# Patient Record
Sex: Male | Born: 1985 | Race: White | Hispanic: No | Marital: Single | Smoking: Current every day smoker
Health system: Southern US, Community
[De-identification: ages and names within clinical notes are randomized; demographics above are authoritative.]

## PROBLEM LIST (undated history)

## (undated) DIAGNOSIS — Z8619 Personal history of other infectious and parasitic diseases: Secondary | ICD-10-CM

## (undated) HISTORY — PX: FRACTURE SURGERY: SHX138

## (undated) HISTORY — DX: Personal history of other infectious and parasitic diseases: Z86.19

---

## 2005-07-16 ENCOUNTER — Ambulatory Visit: Payer: Self-pay | Admitting: Family Medicine

## 2009-06-27 ENCOUNTER — Emergency Department: Payer: Self-pay | Admitting: Emergency Medicine

## 2010-11-15 ENCOUNTER — Encounter: Payer: Self-pay | Admitting: Family Medicine

## 2010-11-15 ENCOUNTER — Ambulatory Visit (INDEPENDENT_AMBULATORY_CARE_PROVIDER_SITE_OTHER): Payer: Self-pay | Admitting: Family Medicine

## 2010-11-15 DIAGNOSIS — M24819 Other specific joint derangements of unspecified shoulder, not elsewhere classified: Secondary | ICD-10-CM

## 2010-11-15 DIAGNOSIS — M25519 Pain in unspecified shoulder: Secondary | ICD-10-CM | POA: Insufficient documentation

## 2010-11-15 DIAGNOSIS — Z9189 Other specified personal risk factors, not elsewhere classified: Secondary | ICD-10-CM | POA: Insufficient documentation

## 2010-11-21 NOTE — Assessment & Plan Note (Signed)
Summary: PT TO RE-EST/ARM PAIN/CLE [employer]   Vital Signs:  Patient profile:   25 year old male Height:      74 inches Weight:      261.25 pounds BMI:     33.66 Temp:     98.1 degrees F oral Pulse rate:   72 / minute Pulse rhythm:   regular BP sitting:   120 / 84  (left arm) Cuff size:   regular  Vitals Entered By: Benny Lennert CMA Duncan Dull) (November 15, 2010 1:53 PM)  History of Present Illness: Chief complaint new patient to re-established with arm pain  25 year old male:  movement does cause some pain, there is some pain and burning works in shipping, breaking down   hasused some sports cream.  maybe some  The patient noted above presents with shoulder pain that has been ongoing for several weeks to months. there is no history of trauma or accident. The patient denies neck pain or radicular symptoms. Denies dislocation, subluxation, separation of the shoulder. But he does describe mechanical clicking and popping in the shoulder.  The patient does complain of pain in the overhead plane. Pain with overhead work.  Medications Tried: advil Ice or Heat: ice Tried PT: No  Prior shoulder Injury: No Prior surgery: No Prior fracture: No  denies frank dislocation lateral shoulder pain  Preventive Screening-Counseling & Management  Caffeine-Diet-Exercise     Does Patient Exercise: no      Drug Use:  no.    Allergies (verified): No Known Drug Allergies  Past History:  Past Medical History: CHICKENPOX, HX OF (ICD-V15.9)    Past Surgical History: none  Family History: Family History Breast cancer 1st degree relative <50 Family History Hypertension Family History Lung cancer  Social History: Occupation:Material handler Married Alcohol use-yes Drug use-no Regular exercise-no former smokerOccupation:  employed Drug Use:  no Does Patient Exercise:  no  Review of Systems       REVIEW OF SYSTEMS  GEN: No systemic complaints, no fevers, chills, sweats, or  other acute illnesses MSK: Detailed in the HPI GI: tolerating PO intake without difficulty Neuro: No numbness, parasthesias, or tingling associated. Otherwise the pertinent positives of the ROS are noted above.    Physical Exam  General:  GEN: Well-developed,well-nourished,in no acute distress; alert,appropriate and cooperative throughout examination HEENT: Normocephalic and atraumatic without obvious abnormalities. No apparent alopecia or balding. Ears, externally no deformities PULM: Breathing comfortably in no respiratory distress EXT: No clubbing, cyanosis, or edema PSYCH: Normally interactive. Cooperative during the interview. Pleasant. Friendly and conversant. Not anxious or depressed appearing. Normal, full affect.  Msk:  Shoulder: L Inspection: No muscle wasting or winging Ecchymosis/edema: neg  AC joint, scapula, clavicle:  mild ttp Cervical spine: NT, full ROM Spurling's: neg Abduction: full, 5/5 Flexion: full, 5/5 IR, full, lift-off: 5/5 - some pain ER at neutral: full, 5/5 AC crossover and compression: mild pos Neer: pos Hawkins: pos Drop Test: neg Empty Can: neg Supraspinatus insertion: NT Bicipital groove: NT Speed's: neg Yergason's: neg Sulcus sign: increased laxity without true sulcus APPREHENSION POSITIVE JOBE RELOCATION POSITIVE CRANK POSITIVE Scapular dyskinesis: none C5-T1 intact Sensation intact Grip 5/5    Impression & Recommendations:  Problem # 1:  SHOULDER JOINT INSTABILITY (ICD-718.81) Assessment New increased laxity at shoulder with mild subluxation events cannot rule out labral pathology with positive crank, jobe relocation, mechanical symptoms. most of pain is from secondary impingement.  RTC and scapular stabilization - would give long conservative trial. I reviewed with the  patient a handout from [employer] and [facility] regarding their condition and a set of home exercises. They indicated that they understood what was  discussed with them. All questions answered.  formal pt   Orders: Physical Therapy Referral (PT)  Problem # 2:  SHOULDER PAIN, LEFT (ICD-719.41) Assessment: New  His updated medication list for this problem includes:    Diclofenac Sodium 75 Mg Tbec (Diclofenac sodium) .Marland Kitchen... 1 by mouth two times a day  Complete Medication List: 1)  Diclofenac Sodium 75 Mg Tbec (Diclofenac sodium) .Marland Kitchen.. 1 by mouth two times a day  Patient Instructions: 1)  YOUTUBE: 2)  ROTATOR CUFF STRENGTHENING AND SCAPULAR STABILIZATION 3)  "SCAPULAR CLOCKS" 4)  Referral Appointment Information 5)  Day/Date: 6)  Time: 7)  Place/MD: 8)  Address: 9)  Phone/Fax: 10)  Patient given appointment information. Information/Orders faxed/mailed.  11)  RECHECK 6 WEEKS Prescriptions: DICLOFENAC SODIUM 75 MG TBEC (DICLOFENAC SODIUM) 1 by mouth two times a day  #60 x 2   Entered and Authorized by:   Hannah Beat MD   Signed by:   Hannah Beat MD on 11/15/2010   Method used:   Electronically to        [facility] (retail)       [address], [address]       [place], [place]  [address]       Ph: [phone]       Fax: [phone]   RxID:   [id number]    Orders Added: 1)  Physical Therapy Referral [PT] 2)  New Patient Level III [99203]    Prevention & Chronic Care Immunizations   Influenza vaccine: Not documented    Tetanus booster: Not documented    Pneumococcal vaccine: Not documented  Other Screening   Smoking status: Not documented   Prior Medications (reviewed today): None Current Allergies (reviewed today): No known allergies

## 2010-12-15 ENCOUNTER — Encounter: Payer: Self-pay | Admitting: Family Medicine

## 2010-12-15 NOTE — Progress Notes (Signed)
Addended by: Adriana Simas on: 12/15/2010 05:44 AM   Modules accepted: Orders

## 2011-01-03 ENCOUNTER — Ambulatory Visit: Payer: BC Managed Care – PPO | Admitting: Family Medicine

## 2011-01-03 DIAGNOSIS — Z0289 Encounter for other administrative examinations: Secondary | ICD-10-CM

## 2011-05-22 ENCOUNTER — Encounter: Payer: Self-pay | Admitting: Family Medicine

## 2011-05-22 ENCOUNTER — Ambulatory Visit (INDEPENDENT_AMBULATORY_CARE_PROVIDER_SITE_OTHER): Payer: BC Managed Care – PPO | Admitting: Family Medicine

## 2011-05-22 VITALS — BP 120/82 | HR 89 | Temp 98.6°F | Ht 74.0 in | Wt 257.1 lb

## 2011-05-22 DIAGNOSIS — F410 Panic disorder [episodic paroxysmal anxiety] without agoraphobia: Secondary | ICD-10-CM

## 2011-05-22 MED ORDER — LORAZEPAM 1 MG PO TABS: 1.0000 mg | ORAL_TABLET | Freq: Three times a day (TID) | ORAL | Status: AC | PRN

## 2011-05-22 MED ORDER — CITALOPRAM HYDROBROMIDE 20 MG PO TABS: 20.0000 mg | ORAL_TABLET | Freq: Every day | ORAL | Status: DC

## 2011-05-22 NOTE — Progress Notes (Signed)
  Subjective:    Patient ID: Shawn Pugh, male    DOB: 07-18-1986, 25 y.o.   MRN: [id number]  HPI    Review of Systems     Objective:   Physical Exam        Assessment & Plan:   1. Panic attacks  citalopram (CELEXA) 20 MG tablet, LORazepam (ATIVAN) 1 MG tablet    >25 minutes spent in face to face time with patient, >50% spent in counselling or coordination of care: pleasant young man, here with wife, who has been having escalating panic attacks, generalized anxiety over the last few months. Worsened after he stopped ETOH (now minimal) was drinking 6-12 drinks a day. Daily symptoms, more at night. Describes some sweating, nausea, chest pain, fear. An "emergency pill" that he borrowed from a relative has helped relieve symptoms. No depression. No crying. Some stress at work and has a 78 month old at home. Supportive wife.   Rec counselling, but he is hesitant right now. They will think about that.  Recheck in 4 weeks

## 2011-05-22 NOTE — Patient Instructions (Signed)
F/u 4-5 weeks  Think about the counselling

## 2011-06-06 ENCOUNTER — Ambulatory Visit (INDEPENDENT_AMBULATORY_CARE_PROVIDER_SITE_OTHER): Payer: BC Managed Care – PPO | Admitting: Family Medicine

## 2011-06-06 ENCOUNTER — Encounter: Payer: Self-pay | Admitting: Family Medicine

## 2011-06-06 DIAGNOSIS — J069 Acute upper respiratory infection, unspecified: Secondary | ICD-10-CM

## 2011-06-06 NOTE — Assessment & Plan Note (Signed)
See instructions

## 2011-06-06 NOTE — Patient Instructions (Signed)
Take Guaifenesin (400mg ), take 11/2 tabs by mouth AM and NOON. Get GUAIFENESIN by  going to [facility] or [facility] and getting MUCOUS RELIEF EXPECTORANT/CONGESTION. DO NOT GET MUCINEX (Timed Release Guaifenesin)  Drink lots of fluids. If ST, Gargle with 30ccs of warm salt water every half hour for 2 days as able. Patience is a virtue.

## 2011-06-06 NOTE — Progress Notes (Signed)
  Subjective:    Patient ID: Shawn Pugh, male    DOB: June 06, 1986, 25 y.o.   MRN: [id number]  HPIPt of Dr Copland's here as acute appt for ST, cough, body aches and weakness. He works at [employer]. 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 He has had ST for a few days, cough and body aches started this AM. He had difficulty getting going today and did not go to work.  He has had temp to 99+, tension/pressure it the head typically bitemporally, no ear pain, ST of a couple days, no rhinitis, cough occas bringing stuff up with chest tightness like burning and weakness. He denies N/V, no diarrhea. HE has taken Sudafed cold and flu med and Vit C.     Review of SystemsNoncontributory except as above.       Objective:   Physical Exam  Constitutional: He appears well-developed and well-nourished. No distress.  HENT:  Head: Normocephalic and atraumatic.  Right Ear: External ear normal.  Left Ear: External ear normal.  Nose: Nose normal.  Mouth/Throat: Oropharynx is clear and moist.  Eyes: Conjunctivae and EOM are normal. Pupils are equal, round, and reactive to light. Right eye exhibits no discharge. Left eye exhibits no discharge.  Neck: Normal range of motion. Neck supple.  Cardiovascular: Normal rate and regular rhythm.   Pulmonary/Chest: Effort normal and breath sounds normal. He has no wheezes.  Lymphadenopathy:    He has no cervical adenopathy.  Skin: He is not diaphoretic.          Assessment & Plan:

## 2011-06-25 ENCOUNTER — Ambulatory Visit: Payer: BC Managed Care – PPO | Admitting: Family Medicine

## 2011-06-25 DIAGNOSIS — Z0289 Encounter for other administrative examinations: Secondary | ICD-10-CM

## 2012-03-14 ENCOUNTER — Emergency Department: Payer: Self-pay | Admitting: Emergency Medicine

## 2012-03-14 LAB — CBC
HCT: 43.4 % (ref 40.0–52.0)
HGB: 15.3 g/dL (ref 13.0–18.0)
MCHC: 35.2 g/dL (ref 32.0–36.0)
MCV: 90 fL (ref 80–100)
RBC: 4.84 10*6/uL (ref 4.40–5.90)
WBC: 4.8 10*3/uL (ref 3.8–10.6)

## 2012-03-14 LAB — COMPREHENSIVE METABOLIC PANEL
Albumin: 4.5 g/dL (ref 3.4–5.0)
Alkaline Phosphatase: 105 U/L (ref 50–136)
Anion Gap: 8 (ref 7–16)
EGFR (Non-African Amer.): >60
Glucose: 112 mg/dL — ABNORMAL HIGH (ref 65–99)
Osmolality: 280 (ref 275–301)
Potassium: 3.6 mmol/L (ref 3.5–5.1)
Sodium: 140 mmol/L (ref 136–145)
Total Protein: 8 g/dL (ref 6.4–8.2)

## 2012-10-10 ENCOUNTER — Ambulatory Visit (INDEPENDENT_AMBULATORY_CARE_PROVIDER_SITE_OTHER): Payer: BC Managed Care – PPO | Admitting: Family Medicine

## 2012-10-10 ENCOUNTER — Encounter: Payer: Self-pay | Admitting: Family Medicine

## 2012-10-10 VITALS — BP 140/88 | HR 78 | Temp 98.6°F | Ht 74.0 in | Wt 202.5 lb

## 2012-10-10 DIAGNOSIS — R5381 Other malaise: Secondary | ICD-10-CM

## 2012-10-10 DIAGNOSIS — R531 Weakness: Secondary | ICD-10-CM | POA: Insufficient documentation

## 2012-10-10 DIAGNOSIS — R51 Headache: Secondary | ICD-10-CM

## 2012-10-10 DIAGNOSIS — R519 Headache, unspecified: Secondary | ICD-10-CM | POA: Insufficient documentation

## 2012-10-10 LAB — CBC WITH DIFFERENTIAL/PLATELET
Basophils Absolute: 0 10*3/uL (ref 0.0–0.1)
Basophils Relative: 0 % (ref 0–1)
Eosinophils Absolute: 0.1 10*3/uL (ref 0.0–0.7)
MCH: 30.5 pg (ref 26.0–34.0)
MCHC: 34.1 g/dL (ref 30.0–36.0)
Neutrophils Relative %: 50 % (ref 43–77)
Platelets: 195 10*3/uL (ref 150–400)
RBC: 4.63 MIL/uL (ref 4.22–5.81)
RDW: 13.6 % (ref 11.5–15.5)

## 2012-10-10 LAB — COMPREHENSIVE METABOLIC PANEL
ALT: 12 U/L (ref 0–53)
AST: 18 U/L (ref 0–37)
Albumin: 5 g/dL (ref 3.5–5.2)
BUN: 16 mg/dL (ref 6–23)
Calcium: 9.7 mg/dL (ref 8.4–10.5)
Chloride: 103 mEq/L (ref 96–112)
Potassium: 4.1 mEq/L (ref 3.5–5.3)

## 2012-10-10 NOTE — Progress Notes (Signed)
  Subjective:    Patient ID: Shawn Pugh, male    DOB: 05-10-1986, 27 y.o.   MRN: [id number]  HPI  27 year old previously healthy  male with headaches intermittantly  For last few months, more frequent in the last few weeks. Describes it as pain and pressure, foggy, lightheaded, hard to focus. Squeezing pain worse on the left lateral head. Rare nausea.  Mild photophobia Occuring more in AM.  Cold hands intermittantly.  Feeling weakness in legs and hands. No fatigue.   No fever.  Some stress. Wife just had a baby.  Skips meals a lot.. Only eats dinner.  He has lost a lot of weight in  last year 130 lbs total. Cut out sodas. Wt Readings from Last 3 Encounters:  10/10/12 202 lb 8 oz (91.853 kg)  06/06/11 251 lb 4 oz (113.966 kg)  05/22/11 257 lb 1.9 oz (116.629 kg)   Family history of HTN.  No thyroid issues in family.  DM runs in family and pt concerned about this.    Review of Systems  Constitutional: Positive for fatigue. Negative for fever.  HENT: Negative for ear pain.   Eyes: Negative for pain.  Respiratory: Negative for cough and shortness of breath.   Cardiovascular: Negative for chest pain.  Gastrointestinal: Negative for abdominal pain.       Objective:   Physical Exam  Constitutional: Vital signs are normal. He appears well-developed and well-nourished.  HENT:  Head: Normocephalic.  Right Ear: Hearing normal.  Left Ear: Hearing normal.  Nose: Nose normal.  Mouth/Throat: Oropharynx is clear and moist and mucous membranes are normal.  Neck: Trachea normal. Carotid bruit is not present. No mass and no thyromegaly present.  Cardiovascular: Normal rate, regular rhythm and normal pulses.  Exam reveals no gallop, no distant heart sounds and no friction rub.   No murmur heard.      No peripheral edema  Pulmonary/Chest: Effort normal and breath sounds normal. No respiratory distress.  Neurological: He has normal strength and normal reflexes. No cranial nerve  deficit or sensory deficit. He displays a negative Romberg sign.  Skin: Skin is warm, dry and intact. No rash noted.  Psychiatric: He has a normal mood and affect. His speech is normal and behavior is normal. Thought content normal.          Assessment & Plan:

## 2012-10-10 NOTE — Addendum Note (Signed)
Addended by: Alvina Chou on: 10/10/2012 03:33 PM   Modules accepted: Orders

## 2012-10-10 NOTE — Assessment & Plan Note (Addendum)
Possible tension headaches. Follow BP to determine if causing issue. Will eval  with labs.  Symptoms may simply be due to skipping meals and hypoglycemia. Pt encouraged to eat 3 meals a day.

## 2012-10-10 NOTE — Patient Instructions (Addendum)
Follow BP at home to make sure not running high as cause of headache. Goal <140/90.  Do not skip meals. Try to eat  3 healthy meals with snacks in between. We will call you with lab results.

## 2012-10-10 NOTE — Assessment & Plan Note (Signed)
Will eval with labs. Given cold intolerance consider thyroid issue.

## 2013-03-03 IMAGING — CR DG CHEST 2V
1 series · 2 of 2 positions shown · non-contrast
Comparison: none

REASON FOR EXAM: cough
COMMENTS:

[Series 1: w chest pa · 0.14mm/px · 2 of 2 slices shown]
[im 1/2]
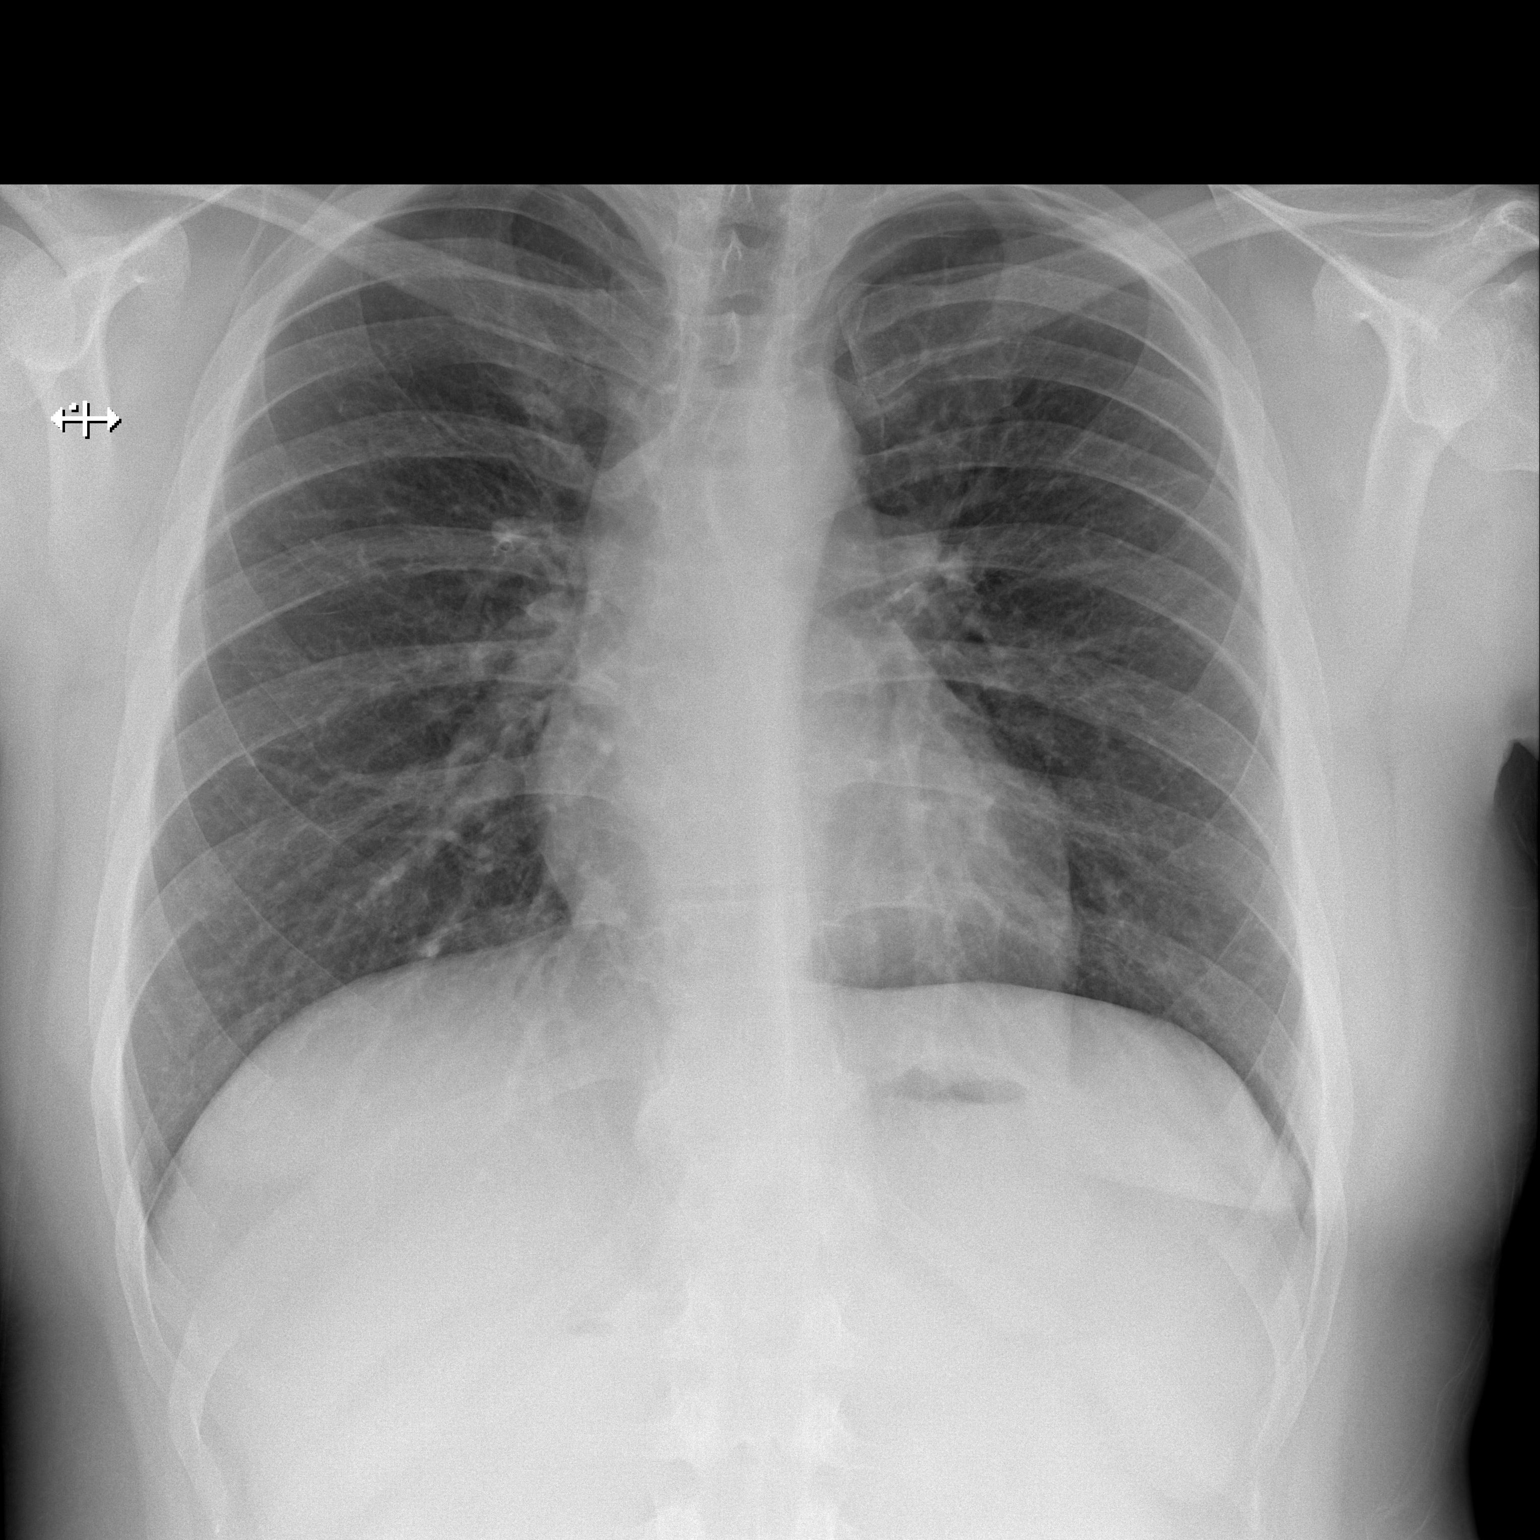
[im 2/2]
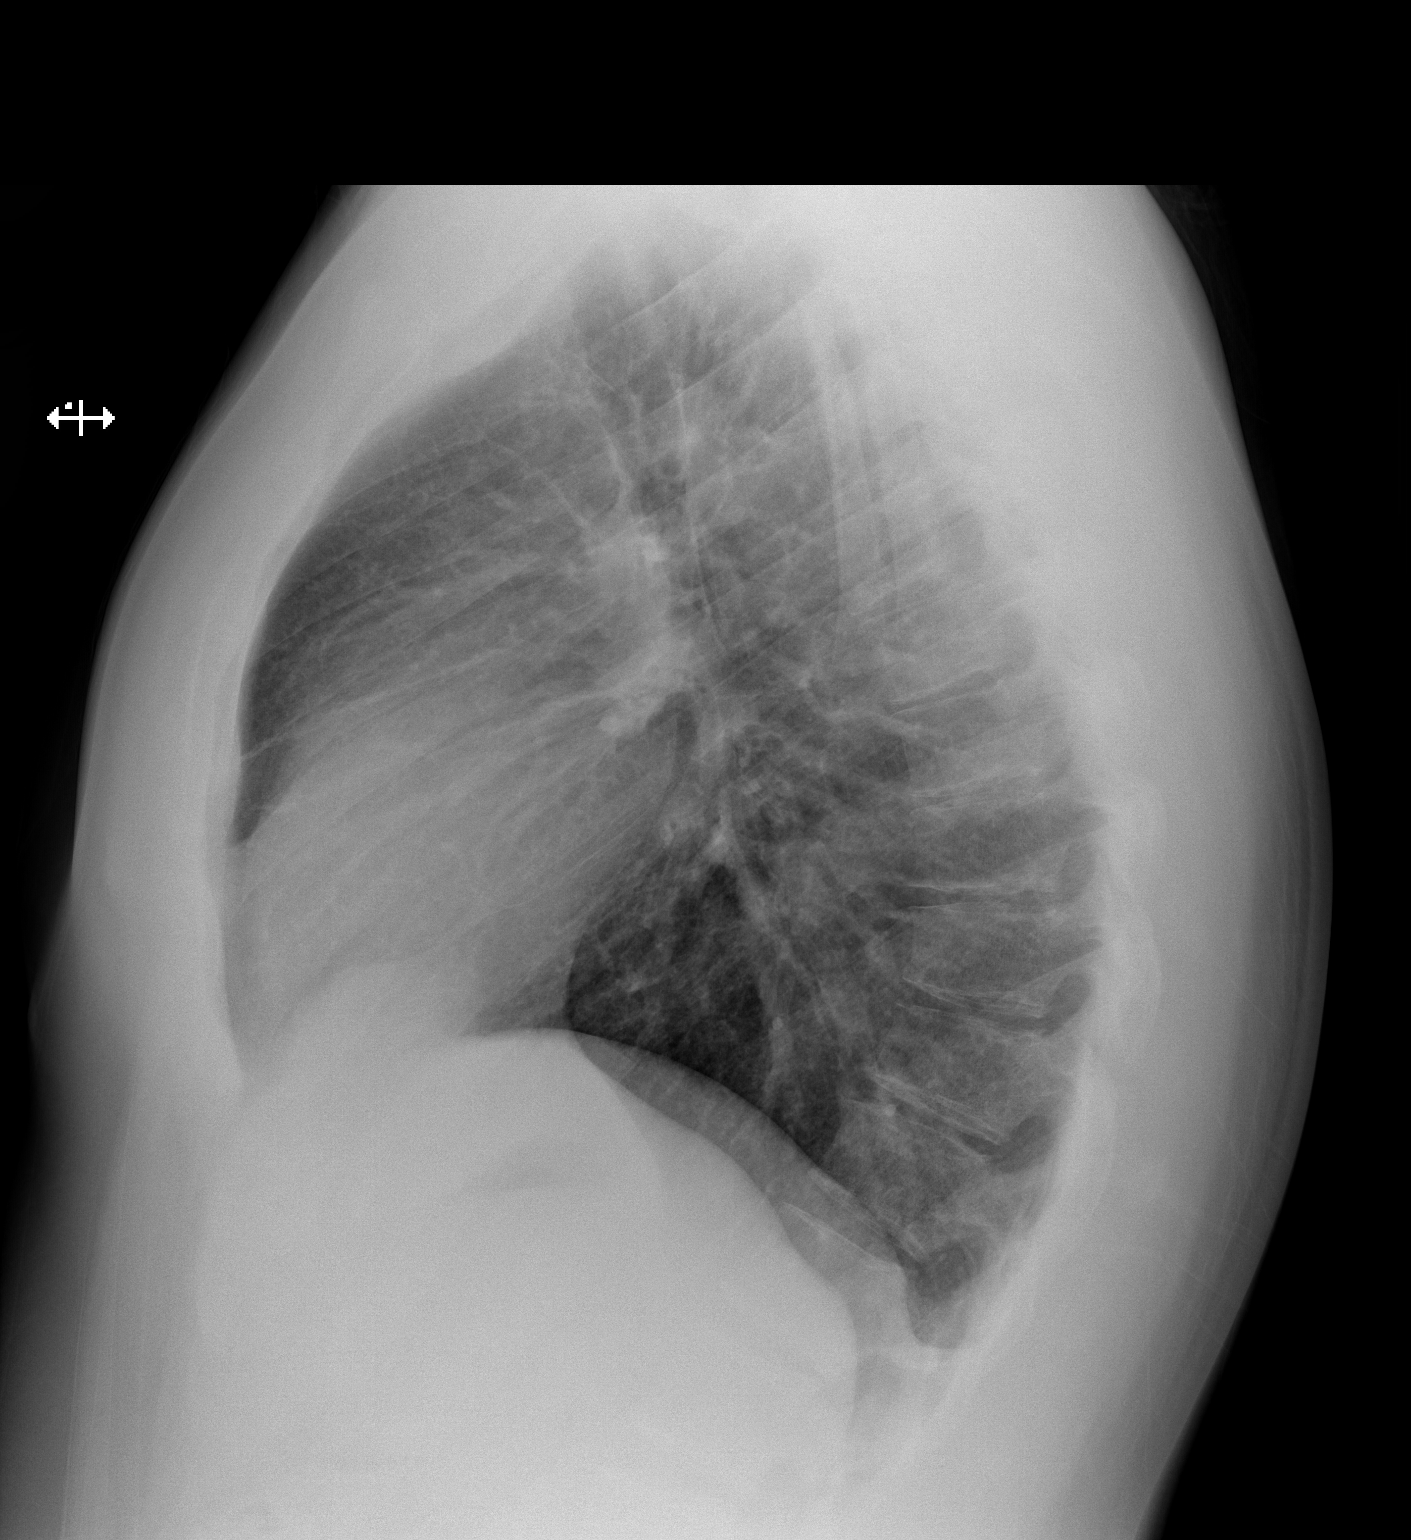

[2 of 2 positions shown; findings below may reference images not displayed]

PROCEDURE:     DXR - DXR CHEST PA (OR AP) AND LATERAL  - March 14, 2012  [DATE]

RESULT:     The lungs are well-expanded. There is no focal infiltrate. The
interstitial markings of both lungs are mildly prominent. Slightly increased
density is noted in the retrocardiac region on the left as compared to the
earlier study. The cardiac silhouette is normal in size. The pulmonary
vascularity is not engorged.
IMPRESSION: 1. There is no focal pneumonia but mildly increased interstitial markings
are noted diffusely. There are also coarse lung markings in the retrocardiac
region on the left which may reflect subsegmental atelectasis.
2. There is no evidence of CHF nor of a pleural effusion.

If the patient's symptoms persist and remain unexplained, a followup CT scan
of the chest may be useful.

## 2015-05-30 ENCOUNTER — Ambulatory Visit (INDEPENDENT_AMBULATORY_CARE_PROVIDER_SITE_OTHER): Payer: Self-pay | Admitting: Primary Care

## 2015-05-30 ENCOUNTER — Encounter: Payer: Self-pay | Admitting: Primary Care

## 2015-05-30 VITALS — BP 124/76 | HR 83 | Temp 98.1°F | Ht 74.0 in | Wt 211.8 lb

## 2015-05-30 DIAGNOSIS — K121 Other forms of stomatitis: Secondary | ICD-10-CM

## 2015-05-30 MED ORDER — LIDOCAINE VISCOUS 2 % MT SOLN: OROMUCOSAL | Status: DC

## 2015-05-30 MED ORDER — AMOXICILLIN-POT CLAVULANATE 875-125 MG PO TABS: 1.0000 | ORAL_TABLET | Freq: Two times a day (BID) | ORAL | Status: DC

## 2015-05-30 NOTE — Patient Instructions (Signed)
Start Augmentin antibiotics for mouth sore. Take 1 tablet by mouth twice daily for 7 days.  Swish and spit the viscous lidocaine four times daily as needed for mouth pain.  Follow up if no improvement once antibiotics are complete.  It was a pleasure meeting you!

## 2015-05-30 NOTE — Progress Notes (Signed)
   Subjective:    Patient ID: Shawn Pugh, male    DOB: [birth year], 29 y.o.   MRN: [id number]  HPI  Shawn Pugh is a 29 year old male who presents today with a chief complaint of mouth sore. He bit the inside of his mouth on the left side about 1 week ago while eating. He has had continued pain and swelling over the weekend which will fluctuate in intensity throughout the day. He's been using OTC orajel and kank-x with temporary relief. Denies fevers, chills. His pain is worse with eating and drinking.  Review of Systems  Constitutional: Negative for fever and chills.  HENT: Positive for sore throat.        Oral pain and swelling to left inner cheek  Gastrointestinal: Positive for nausea.       Past Medical History  Diagnosis Date  . History of chicken pox     Social History   Social History  . Marital Status: Single    Spouse Name: N/A  . Number of Children: N/A  . Years of Education: N/A   Occupational History  . Material handler    Social History Main Topics  . Smoking status: Former Games developer  . Smokeless tobacco: Former Neurosurgeon     Comment: quit 2-3 years ago  . Alcohol Use: Yes     Comment: over the weekend  . Drug Use: No  . Sexual Activity: Not on file   Other Topics Concern  . Not on file   Social History Narrative   No regular exercise    No past surgical history on file.  Family History  Problem Relation Age of Onset  . Breast cancer      1st degree relative  . Hypertension      fam hx  . Lung cancer      fam hx    No Known Allergies  No current outpatient prescriptions on file prior to visit.   No current facility-administered medications on file prior to visit.    BP 124/76 mmHg  Pulse 83  Temp(Src) 98.1 F (36.7 C) (Oral)  Ht  (1.88 m)  Wt 211 lb 12.8 oz (96.072 kg)  BMI 27.18 kg/m2  SpO2 98%    Objective:   Physical Exam  Constitutional: He appears well-nourished.  HENT:  1 cm sore to left side of cheek in mouth.  Erythema and swelling noted. Swelling noted to exterior cheek.  Cardiovascular: Normal rate and regular rhythm.   Pulmonary/Chest: Effort normal and breath sounds normal.  Skin: Skin is warm and dry.          Assessment & Plan:  Oral Ulcer:  Present to left inner cheek x 1 week after biting skin while eating. Increased pain, swelling, and redness of past several days. Swelling to exterior cheek. Ulcer is approx. 1 cm in diameter. RX for Augmentin BID x 7 days and viscous lidocaine QID prn. Follow up PRN or if no improvement with antibiotics.

## 2015-05-30 NOTE — Progress Notes (Signed)
Pre visit review using our clinic review tool, if applicable. No additional management support is needed unless otherwise documented below in the visit note. 

## 2015-10-13 ENCOUNTER — Emergency Department
Admission: EM | Admit: 2015-10-13 | Discharge: 2015-10-13 | Disposition: A | Discharge status: Home / Self Care | Payer: Medicaid Other | Attending: Emergency Medicine | Admitting: Emergency Medicine

## 2015-10-13 ENCOUNTER — Encounter: Payer: Self-pay | Admitting: *Deleted

## 2015-10-13 DIAGNOSIS — K602 Anal fissure, unspecified: Secondary | ICD-10-CM

## 2015-10-13 DIAGNOSIS — Z87891 Personal history of nicotine dependence: Secondary | ICD-10-CM | POA: Insufficient documentation

## 2015-10-13 DIAGNOSIS — K625 Hemorrhage of anus and rectum: Secondary | ICD-10-CM | POA: Diagnosis present

## 2015-10-13 LAB — COMPREHENSIVE METABOLIC PANEL
ALBUMIN: 5 g/dL (ref 3.5–5.0)
ALK PHOS: 59 U/L (ref 38–126)
ALT: 15 U/L — AB (ref 17–63)
AST: 18 U/L (ref 15–41)
Anion gap: 8 (ref 5–15)
BILIRUBIN TOTAL: 1.3 mg/dL — AB (ref 0.3–1.2)
BUN: 14 mg/dL (ref 6–20)
CALCIUM: 9.5 mg/dL (ref 8.9–10.3)
CO2: 25 mmol/L (ref 22–32)
CREATININE: 0.79 mg/dL (ref 0.61–1.24)
Chloride: 107 mmol/L (ref 101–111)
GFR calc Af Amer: >60 mL/min (ref >60)
GFR calc non Af Amer: >60 mL/min (ref >60)
GLUCOSE: 109 mg/dL — AB (ref 65–99)
POTASSIUM: 3.6 mmol/L (ref 3.5–5.1)
Sodium: 140 mmol/L (ref 135–145)
TOTAL PROTEIN: 7.6 g/dL (ref 6.5–8.1)

## 2015-10-13 LAB — CBC
HEMATOCRIT: 42.5 % (ref 40.0–52.0)
Hemoglobin: 14.7 g/dL (ref 13.0–18.0)
MCH: 31.4 pg (ref 26.0–34.0)
MCHC: 34.6 g/dL (ref 32.0–36.0)
MCV: 90.7 fL (ref 80.0–100.0)
PLATELETS: 197 10*3/uL (ref 150–440)
RBC: 4.69 MIL/uL (ref 4.40–5.90)
RDW: 13.8 % (ref 11.5–14.5)
WBC: 7.1 10*3/uL (ref 3.8–10.6)

## 2015-10-13 MED ORDER — HYDROCORTISONE ACETATE 25 MG RE SUPP: 25.0000 mg | Freq: Two times a day (BID) | RECTAL | Status: DC

## 2015-10-13 NOTE — ED Notes (Signed)
States he felt a "pop" in his rectum and states he went to the bathroom and noticed blood, denies blood in his stool but states just blood coming out, denies any known hx of Hemorid, awake and alert in no acute distress, denies any pain

## 2015-10-13 NOTE — Discharge Instructions (Signed)
Anal Fissure, Adult °An anal fissure is a small tear or crack in the skin around the anus. Bleeding from a fissure usually stops on its own within a few minutes. However, bleeding will often occur again with each bowel movement until the crack heals. °CAUSES °This condition may be caused by: °· Passing large, hard stool (feces). °· Frequent diarrhea. °· Constipation. °· Inflammatory bowel disease (Crohn disease or ulcerative colitis). °· Infections. °· Anal sex. °SYMPTOMS °Symptoms of this condition include: °· Bleeding from the rectum. °· Small amounts of blood seen on your stool, on toilet paper, or in the toilet after a bowel movement. °· Painful bowel movements. °· Itching or irritation around the anus. °DIAGNOSIS  °A health care provider may diagnose this condition by closely examining the anal area. An anal fissure can usually be seen with careful inspection. In some cases, a rectal exam may be performed, or a short tube (anoscope) may be used to examine the anal canal. °TREATMENT °Treatment for this condition may include: °· Taking steps to avoid constipation. This may include making changes to your diet, such as increasing your intake of fiber or fluid. °· Taking fiber supplements. These supplements can soften your stool to help make bowel movements easier. Your health care provider may also prescribe a stool softener if your stool is often hard. °· Taking sitz baths. This may help to heal the tear. °· Using medicated creams or ointments. These may be prescribed to lessen discomfort. °HOME CARE INSTRUCTIONS °Eating and Drinking °· Avoid foods that may be constipating, such as bananas and dairy products. °· Drink enough fluid to keep your urine clear or pale yellow. °· Maintain a diet that is high in fruits, whole grains, and vegetables. °General Instructions °· Keep the anal area as clean and dry as possible. °· Take sitz baths as told by your health care provider. Do not use soap in the sitz baths. °· Take  over-the-counter and prescription medicines only as told by your health care provider. °· Use creams or ointments only as told by your health care provider. °· Keep all follow-up visits as told by your health care provider. This is important. °SEEK MEDICAL CARE IF: °· You have more bleeding. °· You have a fever. °· You have diarrhea that is mixed with blood. °· You continue to have pain. °· Your problem is getting worse rather than better. °  °This information is not intended to replace advice given to you by your health care provider. Make sure you discuss any questions you have with your health care provider. °  °Document Released: 08/20/2005 Document Revised: 05/11/2015 Document Reviewed: 11/15/2014 °Elsevier Interactive Patient Education ©2016 [employer] ° °

## 2015-10-13 NOTE — ED Provider Notes (Signed)
[facility] Emergency Department Provider Note     Time seen: ----------------------------------------- 3:38 PM on 10/13/2015 -----------------------------------------    I have reviewed the triage vital signs and the nursing notes.   HISTORY  Chief Complaint Rectal Bleeding    HPI Shawn Pugh is a 30 y.o. male who presents to ER for rectal bleeding. Patient states she was pushing a heavy cart and stepdown with one leg to a lower level and he felt a pop in his rectal area. Patient describes it as like a stinging infants 80ssome rectal bleeding. Patient denies any blood in the stool, denies any recent history of this.   Past Medical History  Diagnosis Date  . History of chicken pox     Patient Active Problem List   Diagnosis Date Noted  . Headache(784.0) 10/10/2012  . Weakness 10/10/2012  . Panic attacks 05/22/2011  . SHOULDER JOINT INSTABILITY 11/15/2010  . SHOULDER PAIN, LEFT 11/15/2010  . CHICKENPOX, HX OF 11/15/2010    History reviewed. No pertinent past surgical history.  Allergies Review of patient's allergies indicates no known allergies.  Social History Social History  Substance Use Topics  . Smoking status: Former Games developer  . Smokeless tobacco: Former Neurosurgeon     Comment: quit 2-3 years ago  . Alcohol Use: Yes     Comment: over the weekend    Review of Systems Constitutional: Negative for fever. Eyes: Negative for visual changes. ENT: Negative for sore throat. Cardiovascular: Negative for chest pain. Respiratory: Negative for shortness of breath. Gastrointestinal: Positive for rectal bleeding, negative for abdominal pain Genitourinary: Negative for dysuria. Musculoskeletal: Negative for back pain. Skin: Negative for rash. Neurological: Negative for headaches, focal weakness or numbness.  10-point ROS otherwise negative.  ____________________________________________   PHYSICAL EXAM:  VITAL SIGNS: ED Triage Vitals   Enc Vitals Group     BP 10/13/15 1505 150/88 mmHg     Pulse Rate 10/13/15 1505 103     Resp 10/13/15 1505 16     Temp 10/13/15 1505 98.1 F (36.7 C)     Temp Source 10/13/15 1505 Oral     SpO2 10/13/15 1505 100 %     Weight 10/13/15 1505 195 lb (88.451 kg)     Height 10/13/15 1505  (1.88 m)     Head Cir --      Peak Flow --      Pain Score --      Pain Loc --      Pain Edu? --      Excl. in GC? --     Constitutional: Alert and oriented. Well appearing and in no distress. Eyes: Conjunctivae are normal. PERRL. Normal extraocular movements. Gastrointestinal: Soft and nontender. No distention. No abdominal bruits.  Rectal: Rectal exam reveals tenderness and bright red blood, possible fissure at 6:00, no hemorrhoids Musculoskeletal: Nontender with normal range of motion in all extremities. No joint effusions.  No lower extremity tenderness nor edema. Skin:  Skin is warm, dry and intact. No rash noted. ____________________________________________  ED COURSE:  Pertinent labs & imaging results that were available during my care of the patient were reviewed by me and considered in my medical decision making (see chart for details). Patient is in no acute distress, will check basic labs and reevaluate. ____________________________________________    LABS (pertinent positives/negatives)  Labs Reviewed  COMPREHENSIVE METABOLIC PANEL - Abnormal; Notable for the following:    Glucose, Bld 109 (*)    ALT 15 (*)  Total Bilirubin 1.3 (*)    All other components within normal limits  CBC  POC OCCULT BLOOD, ED   ____________________________________________  FINAL ASSESSMENT AND PLAN  Anal fissure  Plan: Patient with labs and imaging as dictated above. Patient states she's having diarrhea currently, will encourage him to Continue to keep his stool soft. I will prescribe Anusol suppositories and he can follow-up with gastroenterology.   Emily Filbert, MD   Emily Filbert, MD 10/13/15 [phone]

## 2015-12-13 ENCOUNTER — Encounter: Payer: Self-pay | Admitting: Internal Medicine

## 2015-12-13 ENCOUNTER — Telehealth: Payer: Self-pay | Admitting: Family Medicine

## 2015-12-13 ENCOUNTER — Ambulatory Visit (INDEPENDENT_AMBULATORY_CARE_PROVIDER_SITE_OTHER): Payer: BLUE CROSS/BLUE SHIELD | Admitting: Internal Medicine

## 2015-12-13 ENCOUNTER — Ambulatory Visit: Payer: Self-pay | Admitting: Internal Medicine

## 2015-12-13 VITALS — BP 122/80 | HR 88 | Temp 98.5°F | Wt 195.0 lb

## 2015-12-13 DIAGNOSIS — R200 Anesthesia of skin: Secondary | ICD-10-CM

## 2015-12-13 DIAGNOSIS — R109 Unspecified abdominal pain: Secondary | ICD-10-CM | POA: Insufficient documentation

## 2015-12-13 DIAGNOSIS — R208 Other disturbances of skin sensation: Secondary | ICD-10-CM | POA: Diagnosis not present

## 2015-12-13 DIAGNOSIS — R1033 Periumbilical pain: Secondary | ICD-10-CM

## 2015-12-13 LAB — CBC WITH DIFFERENTIAL/PLATELET
BASOS ABS: 0.1 10*3/uL (ref 0.0–0.1)
BASOS PCT: 0.8 % (ref 0.0–3.0)
Eosinophils Absolute: 0.4 10*3/uL (ref 0.0–0.7)
Eosinophils Relative: 5.3 % — ABNORMAL HIGH (ref 0.0–5.0)
HEMATOCRIT: 48.9 % (ref 39.0–52.0)
Hemoglobin: 16.6 g/dL (ref 13.0–17.0)
LYMPHS ABS: 1.4 10*3/uL (ref 0.7–4.0)
LYMPHS PCT: 20.8 % (ref 12.0–46.0)
MCHC: 33.9 g/dL (ref 30.0–36.0)
MCV: 93 fl (ref 78.0–100.0)
MONOS PCT: 6 % (ref 3.0–12.0)
Monocytes Absolute: 0.4 10*3/uL (ref 0.1–1.0)
NEUTROS ABS: 4.5 10*3/uL (ref 1.4–7.7)
NEUTROS PCT: 67.1 % (ref 43.0–77.0)
PLATELETS: 203 10*3/uL (ref 150.0–400.0)
RBC: 5.26 Mil/uL (ref 4.22–5.81)
RDW: 13.6 % (ref 11.5–15.5)
WBC: 6.8 10*3/uL (ref 4.0–10.5)

## 2015-12-13 LAB — COMPREHENSIVE METABOLIC PANEL
ALT: 10 U/L (ref 0–53)
AST: 14 U/L (ref 0–37)
Albumin: 4.7 g/dL (ref 3.5–5.2)
Alkaline Phosphatase: 49 U/L (ref 39–117)
BILIRUBIN TOTAL: 1.2 mg/dL (ref 0.2–1.2)
BUN: 10 mg/dL (ref 6–23)
CALCIUM: 10.1 mg/dL (ref 8.4–10.5)
CHLORIDE: 103 meq/L (ref 96–112)
CO2: 31 meq/L (ref 19–32)
CREATININE: 0.99 mg/dL (ref 0.40–1.50)
GFR: 94.48 mL/min (ref >60.00)
GLUCOSE: 105 mg/dL — AB (ref 70–99)
Potassium: 4.7 mEq/L (ref 3.5–5.1)
Sodium: 140 mEq/L (ref 135–145)
Total Protein: 7.3 g/dL (ref 6.0–8.3)

## 2015-12-13 LAB — LIPASE: LIPASE: 13 U/L (ref 11.0–59.0)

## 2015-12-13 MED ORDER — OMEPRAZOLE 20 MG PO CPDR: 20.0000 mg | DELAYED_RELEASE_CAPSULE | Freq: Every day | ORAL | Status: DC

## 2015-12-13 MED ORDER — ONDANSETRON HCL 4 MG PO TABS: 4.0000 mg | ORAL_TABLET | Freq: Three times a day (TID) | ORAL | Status: DC | PRN

## 2015-12-13 NOTE — Assessment & Plan Note (Signed)
These symptom are atypical for GERD but this is what he used to have (AM nausea if he ate late) Could be ulcer too ?pancreatitis or liver/gallbladder are possible--though less likely Will check labs Begin empiric omeprazole Advised to stop smoking

## 2015-12-13 NOTE — Telephone Encounter (Signed)
Will see at OV 

## 2015-12-13 NOTE — Patient Instructions (Addendum)
Please start the omeprazole and take it daily on an empty stomach. Stop smoking!! Let us know if you are not better in the next couple of days

## 2015-12-13 NOTE — Telephone Encounter (Signed)
Pt has appt 12/13/15 at 11 Am with Dr Alphonsus SiasLetvak.

## 2015-12-13 NOTE — Assessment & Plan Note (Signed)
Probably related to hanging over the toilet bowl for over 2 hours this am Already improving Observe only--- reevaluate if persists

## 2015-12-13 NOTE — Progress Notes (Signed)
Pre visit review using our clinic review tool, if applicable. No additional management support is needed unless otherwise documented below in the visit note. 

## 2015-12-13 NOTE — Telephone Encounter (Signed)
Patient Name: Shawn SabinaMATTHEW Madara  DOB: 08-11-1986    Initial Comment Caller states both hands are numb from fore arm numb, having nausea but no vomiting;    Nurse Assessment  Nurse: Scarlette ArStandifer, RN, Heather Date/Time (Eastern Time): 12/13/2015 9:05:15 AM  Confirm and document reason for call. If symptomatic, describe symptoms. You must click the next button to save text entered. ---Caller states that he is having tingling in both arms from the elbow through the hands, they feel weak and a little painful. This started this morning. He has had nausea for about a week  Has the patient traveled out of the country within the last 30 days? ---Not Applicable  Does the patient have any new or worsening symptoms? ---Yes  Will a triage be completed? ---Yes  Related visit to physician within the last 2 weeks? ---No  Does the PT have any chronic conditions? (i.e. diabetes, asthma, etc.) ---No  Is this a behavioral health or substance abuse call? ---No     Guidelines    Guideline Title Affirmed Question Affirmed Notes  Neurologic Deficit Back pain (and neurologic deficit)    Final Disposition User   See Physician within 4 Hours (or PCP triage) Scarlette ArStandifer, RN, Heather    Referrals  REFERRED TO PCP OFFICE   Disagree/Comply: Comply   Appt with Dr. Alphonsus SiasLetvak at 11 am today.

## 2015-12-13 NOTE — Progress Notes (Signed)
   Subjective:    Patient ID: Shawn Pugh, male    DOB: June 21, 1986, 30 y.o.   MRN: [id number]  HPI Here due to various symptoms  Has been having nausea for about a week Past reflux as a child--improved but now wondering if it is coming back Feels especially bad in the AM--then it improves Nausea this am--unable to vomit Numbness in arms today and some pain from elbows down  Feels weak and shaky Arm and leg sore and stiff when walking around--more on right than left---just today Wonders about high blood pressure Also wondered if it could be from hanging over the toilet bowl for over 2 hours this morning  Does have burning sensation in pit of abdomen Improves with eating  No current outpatient prescriptions on file prior to visit.   No current facility-administered medications on file prior to visit.    No Known Allergies  Past Medical History  Diagnosis Date  . History of chicken pox     No past surgical history on file.  Family History  Problem Relation Age of Onset  . Breast cancer      1st degree relative  . Hypertension      fam hx  . Lung cancer      fam hx    Social History   Social History  . Marital Status: Single    Spouse Name: N/A  . Number of Children: N/A  . Years of Education: N/A   Occupational History  . Material handler    Social History Main Topics  . Smoking status: Current Every Day Smoker    Start date: 10/05/2015  . Smokeless tobacco: Current User     Comment: quit 2-3 years ago  . Alcohol Use: 0.0 oz/week    0 Standard drinks or equivalent per week     Comment: over the weekend  . Drug Use: No  . Sexual Activity: Not on file   Other Topics Concern  . Not on file   Social History Narrative   No regular exercise   Review of Systems Bowels are okay. No blood in stool or black stool No urinary symptoms No travel out of state--no known ill exposures No clear cut fever--but felt cold and shaky this morning No weight  loss in the past week--used to be over 300# though    Objective:   Physical Exam  Constitutional: He appears well-developed. No distress.  Neck: Normal range of motion. Neck supple. No thyromegaly present.  Cardiovascular: Normal rate, regular rhythm and normal heart sounds.  Exam reveals no gallop.   No murmur heard. Pulmonary/Chest: Effort normal and breath sounds normal. No respiratory distress. He has no wheezes. He has no rales.  Abdominal: Soft. Bowel sounds are normal. He exhibits no distension and no mass. There is no rebound and no guarding.  Mild periumbilical tenderness  Musculoskeletal: He exhibits no edema.  No joint swelling in arms/hands Normal ROM  No tenderness  Lymphadenopathy:    He has no cervical adenopathy.  Neurological:  Normal strength in hands          Assessment & Plan:

## 2015-12-14 ENCOUNTER — Telehealth: Payer: Self-pay

## 2015-12-14 MED ORDER — PANTOPRAZOLE SODIUM 40 MG PO TBEC: 40.0000 mg | DELAYED_RELEASE_TABLET | Freq: Every day | ORAL | Status: DC

## 2015-12-14 NOTE — Telephone Encounter (Signed)
Made the appropriate changes in the med list and sent new rx.

## 2015-12-14 NOTE — Telephone Encounter (Signed)
[facility]raz[facility] over omeprazole. Do you want to change his medication?

## 2015-12-14 NOTE — Telephone Encounter (Signed)
Either is fine so have them switch to pantoprazole 40mg  daily instead

## 2019-04-02 ENCOUNTER — Other Ambulatory Visit: Payer: Self-pay | Admitting: Family Medicine

## 2019-04-02 DIAGNOSIS — Z20822 Contact with and (suspected) exposure to covid-19: Secondary | ICD-10-CM

## 2019-04-04 LAB — NOVEL CORONAVIRUS, NAA: SARS-CoV-2, NAA: NOT DETECTED

## 2019-07-03 ENCOUNTER — Other Ambulatory Visit: Payer: Self-pay | Admitting: *Deleted

## 2019-07-03 DIAGNOSIS — Z20822 Contact with and (suspected) exposure to covid-19: Secondary | ICD-10-CM

## 2019-07-04 LAB — NOVEL CORONAVIRUS, NAA: SARS-CoV-2, NAA: NOT DETECTED

## 2023-03-11 ENCOUNTER — Ambulatory Visit
Admission: EM | Admit: 2023-03-11 | Discharge: 2023-03-11 | Disposition: A | Discharge status: Home / Self Care | Payer: BC Managed Care – PPO | Attending: Emergency Medicine | Admitting: Emergency Medicine

## 2023-03-11 ENCOUNTER — Ambulatory Visit (INDEPENDENT_AMBULATORY_CARE_PROVIDER_SITE_OTHER): Payer: BC Managed Care – PPO

## 2023-03-11 DIAGNOSIS — M25471 Effusion, right ankle: Secondary | ICD-10-CM | POA: Diagnosis not present

## 2023-03-11 DIAGNOSIS — Z23 Encounter for immunization: Secondary | ICD-10-CM

## 2023-03-11 DIAGNOSIS — M25571 Pain in right ankle and joints of right foot: Secondary | ICD-10-CM | POA: Diagnosis not present

## 2023-03-11 DIAGNOSIS — R1031 Right lower quadrant pain: Secondary | ICD-10-CM | POA: Diagnosis not present

## 2023-03-11 DIAGNOSIS — M25562 Pain in left knee: Secondary | ICD-10-CM

## 2023-03-11 DIAGNOSIS — M25579 Pain in unspecified ankle and joints of unspecified foot: Secondary | ICD-10-CM | POA: Insufficient documentation

## 2023-03-11 MED ORDER — TETANUS-DIPHTH-ACELL PERTUSSIS 5-2.5-18.5 LF-MCG/0.5 IM SUSY
0.5000 mL | PREFILLED_SYRINGE | Freq: Once | INTRAMUSCULAR | Status: AC
  Administered 2023-03-11: 0.5 mL via INTRAMUSCULAR

## 2023-03-11 NOTE — Discharge Instructions (Addendum)
Go to the emergency department if you have worsening symptoms.    Take Tylenol or ibuprofen as needed for discomfort.  Rest and elevate your injured joints.  Wear the walking boot and knee sleeve as directed.  Follow-up with an orthopedist such as the one listed below.

## 2023-03-11 NOTE — ED Provider Notes (Signed)
Renaldo Fiddler    CSN: [id number] Arrival date & time: 03/11/23  1848      History   Chief Complaint Chief Complaint  Patient presents with   Ankle Pain   Knee Injury    HPI Shawn Pugh is a 36 y.o. male.  Patient presents with right ankle pain, left knee pain, bruising on legs, and abrasion on left knee after he accidentally rolled his right ankle then fell down 2 steps while holding a chair yesterday.  No head injury or loss of consciousness.  He also reports right lower abdominal pain since yesterday.  No OTC medications taken today.  No numbness, weakness, fever, chest pain, shortness of breath, vomiting, diarrhea, blood in stool, dysuria, hematuria, or other symptoms.  Last tetanus unknown.    The history is provided by the patient and medical records.    Past Medical History:  Diagnosis Date   History of chicken pox     Patient Active Problem List   Diagnosis Date Noted   Pain in joint, ankle and foot 03/11/2023   Abdominal pain 12/13/2015   Arm numbness 12/13/2015   Headache(784.0) 10/10/2012   Weakness 10/10/2012   Panic attacks 05/22/2011   SHOULDER JOINT INSTABILITY 11/15/2010   SHOULDER PAIN, LEFT 11/15/2010   CHICKENPOX, HX OF 11/15/2010    Past Surgical History:  Procedure Laterality Date   FRACTURE SURGERY         Home Medications    Prior to Admission medications   Medication Sig Start Date End Date Taking? Authorizing Provider  ondansetron (ZOFRAN) 4 MG tablet Take 1 tablet (4 mg total) by mouth every 8 (eight) hours as needed for nausea or vomiting. 12/13/15   Karie Schwalbe, MD  pantoprazole (PROTONIX) 40 MG tablet Take 1 tablet (40 mg total) by mouth daily. 12/14/15   Karie Schwalbe, MD    Family History Family History  Problem Relation Age of Onset   Breast cancer Other        1st degree relative   Hypertension Other        fam hx   Lung cancer Other        fam hx    Social History Social History   Tobacco Use    Smoking status: Every Day    Types: Cigarettes    Start date: 10/05/2015   Smokeless tobacco: Current   Tobacco comments:    quit 2-3 years ago  Substance Use Topics   Alcohol use: Yes    Alcohol/week: 0.0 standard drinks of alcohol    Comment: over the weekend   Drug use: No     Allergies   Patient has no known allergies.   Review of Systems Review of Systems  Constitutional:  Negative for chills and fever.  Respiratory:  Negative for cough and shortness of breath.   Cardiovascular:  Negative for chest pain and palpitations.  Gastrointestinal:  Positive for abdominal pain. Negative for blood in stool, constipation, diarrhea, nausea and vomiting.  Genitourinary:  Negative for dysuria and hematuria.  Musculoskeletal:  Positive for arthralgias, gait problem and joint swelling.  Skin:  Positive for color change and wound.  Neurological:  Negative for weakness and numbness.     Physical Exam Triage Vital Signs ED Triage Vitals  Enc Vitals Group     BP      Pulse      Resp      Temp      Temp src  SpO2      Weight      Height      Head Circumference      Peak Flow      Pain Score      Pain Loc      Pain Edu?      Excl. in GC?    No data found.  Updated Vital Signs BP 122/80   Pulse (!) 106   Temp 97.8 F (36.6 C)   Resp 18   SpO2 98%   Visual Acuity Right Eye Distance:   Left Eye Distance:   Bilateral Distance:    Right Eye Near:   Left Eye Near:    Bilateral Near:     Physical Exam Vitals and nursing note reviewed.  Constitutional:      General: He is not in acute distress.    Appearance: He is well-developed.  HENT:     Mouth/Throat:     Mouth: Mucous membranes are moist.  Cardiovascular:     Rate and Rhythm: Normal rate and regular rhythm.     Heart sounds: Normal heart sounds.  Pulmonary:     Effort: Pulmonary effort is normal. No respiratory distress.     Breath sounds: Normal breath sounds.  Abdominal:     Palpations: Abdomen  is soft.     Tenderness: There is abdominal tenderness in the right lower quadrant. There is no right CVA tenderness, left CVA tenderness, guarding or rebound.  Musculoskeletal:        General: Swelling and tenderness present. No deformity. Normal range of motion.     Cervical back: Neck supple.       Legs:       Feet:  Skin:    General: Skin is warm and dry.     Capillary Refill: Capillary refill takes less than 2 seconds.     Findings: Bruising and lesion present.  Neurological:     General: No focal deficit present.     Mental Status: He is alert and oriented to person, place, and time.     Sensory: No sensory deficit.     Motor: No weakness.     Gait: Gait normal.  Psychiatric:        Mood and Affect: Mood normal.        Behavior: Behavior normal.      UC Treatments / Results  Labs (all labs ordered are listed, but only abnormal results are displayed) Labs Reviewed - No data to display  EKG   Radiology DG Ankle Complete Right  Result Date: 03/11/2023 CLINICAL DATA:  Injured right ankle and fall yesterday. EXAM: RIGHT ANKLE - COMPLETE 3+ VIEW COMPARISON:  None Available. FINDINGS: There is a 6 mm well corticated ossicle just distal to the medial malleolus. There is a curvilinear 13 x 4 mm ossicle just distal to the fibula. These likely represent the sequelae of remote trauma. The ankle mortise is symmetric and intact. Mild lateral malleolar soft tissue swelling. No acute fracture line is seen. Joint spaces are preserved. IMPRESSION: 1. Mild lateral malleolar soft tissue swelling. No acute fracture is seen. 2. Sequelae of remote trauma at the distal medial and lateral malleoli. Electronically Signed   By: Neita Garnet M.D.   On: 03/11/2023 19:46   DG Knee Complete 4 Views Left  Result Date: 03/11/2023 CLINICAL DATA:  Pain following injury. EXAM: LEFT KNEE - COMPLETE 4+ VIEW COMPARISON:  None Available. FINDINGS: Normal bone mineralization. Joint spaces are preserved. No  acute  fracture is seen. No dislocation. IMPRESSION: Normal left knee radiographs. Electronically Signed   By: Neita Garnet M.D.   On: 03/11/2023 19:44    Procedures Procedures (including critical care time)  Medications Ordered in UC Medications  Tdap (BOOSTRIX) injection 0.5 mL (0.5 mLs Intramuscular Given 03/11/23 1932)    Initial Impression / Assessment and Plan / UC Course  I have reviewed the triage vital signs and the nursing notes.  Pertinent labs & imaging results that were available during my care of the patient were reviewed by me and considered in my medical decision making (see chart for details).   Acute left knee pain, acute pain and swelling of right ankle, right lower quadrant abdominal pain.  Patient has mild tenderness in his right lower quadrant.  No rebound or guarding.  Discussed limitations of evaluation in an urgent care setting.  He declines transfer to the ED at this time.  No fever.  ED precautions discussed.  Education provided on abdominal pain.  X-rays showed no acute bony abnormality of right ankle or left knee.  Tetanus updated today.  Treating with rest, elevation, ice packs, knee sleeve, walking boot, Tylenol or ibuprofen as needed.  Instructed patient to follow-up with an orthopedist.  Contact information for on-call Ortho provided.  Education provided on ankle pain and knee pain.  Patient agrees to plan of care.  Final Clinical Impressions(s) / UC Diagnoses   Final diagnoses:  Acute pain of left knee  Pain and swelling of right ankle  Right lower quadrant abdominal pain     Discharge Instructions      Go to the emergency department if you have worsening symptoms.    Take Tylenol or ibuprofen as needed for discomfort.  Rest and elevate your injured joints.  Wear the walking boot and knee sleeve as directed.  Follow-up with an orthopedist such as the one listed below.     ED Prescriptions   None    PDMP not reviewed this encounter.   Mickie Bail, NP 03/11/23 2000

## 2023-03-11 NOTE — ED Triage Notes (Addendum)
Patient to Urgent Care with complaints of a fall yesterday where he injured his right ankle and left knee. Bruising and abrasions over bilateral legs.   Incident occurred yesterday- states he fell down 1-2 stairs when helping a friend move. No blood thinner usage.   Unsure of last TDAP.

## 2023-06-17 ENCOUNTER — Ambulatory Visit
Admission: EM | Admit: 2023-06-17 | Discharge: 2023-06-17 | Disposition: A | Discharge status: Home / Self Care | Payer: BC Managed Care – PPO | Attending: Physician Assistant | Admitting: Physician Assistant

## 2023-06-17 DIAGNOSIS — J069 Acute upper respiratory infection, unspecified: Secondary | ICD-10-CM | POA: Insufficient documentation

## 2023-06-17 DIAGNOSIS — R051 Acute cough: Secondary | ICD-10-CM | POA: Insufficient documentation

## 2023-06-17 DIAGNOSIS — R0981 Nasal congestion: Secondary | ICD-10-CM | POA: Diagnosis not present

## 2023-06-17 DIAGNOSIS — Z1152 Encounter for screening for COVID-19: Secondary | ICD-10-CM | POA: Diagnosis not present

## 2023-06-17 MED ORDER — PROMETHAZINE-DM 6.25-15 MG/5ML PO SYRP: 5.0000 mL | ORAL_SOLUTION | Freq: Four times a day (QID) | ORAL | 0 refills | Status: AC | PRN

## 2023-06-17 MED ORDER — FLUTICASONE PROPIONATE 50 MCG/ACT NA SUSP: 1.0000 | Freq: Every day | NASAL | 0 refills | Status: AC

## 2023-06-17 NOTE — ED Triage Notes (Signed)
Patient to Urgent Care with complaints of sore throat/ nasal congestion/ cough. Possible fevers.   Reports symptoms started on Saturday. Reports yesterday started experiencing some stomach upset/ nausea and diarrhea/ body aches.  Taking tylenol.

## 2023-06-17 NOTE — Discharge Instructions (Addendum)
My Chart user name: [email or web address]    We are testing you for COVID.  We will contact you if you are positive.  Please monitor your MyChart for these results or call us if you have any concerns.  Use Promethazine DM for cough.  This will make you sleepy so do not drive drink alcohol while taking it.  Use fluticasone nasal spray for congestion.  I also recommend over-the-counter medications including Mucinex, Tylenol, ibuprofen as well as nasal saline/sinus rinses for additional symptom relief.  If your symptoms are not improving within a week please return for reevaluation.  If anything worsens you need to be seen immediately.

## 2023-06-17 NOTE — ED Provider Notes (Signed)
Renaldo Fiddler    CSN: [id number] Arrival date & time: 06/17/23  0827      History   Chief Complaint Chief Complaint  Patient presents with   Sore Throat   Nasal Congestion   Generalized Body Aches    HPI CLAUDELL WOHLER is a 37 y.o. male.   Patient presents today with a 2-day history of URI symptoms including sore throat, nasal congestion, body aches, generalized weakness, subjective fever.  Denies any chest pain, shortness of breath.  Denies any known sick contacts.  He has tried Tylenol without improvement of symptoms.  He has not had COVID in the past.  He did have initial COVID-19 vaccinations but has not had most recent booster.  Denies any recent antibiotics or steroids.  He denies any significant past medical history including allergies, asthma, COPD, diabetes.  He is a former smoker.    Past Medical History:  Diagnosis Date   History of chicken pox     Patient Active Problem List   Diagnosis Date Noted   Pain in joint, ankle and foot 03/11/2023   Abdominal pain 12/13/2015   Arm numbness 12/13/2015   Headache 10/10/2012   Weakness 10/10/2012   Panic attacks 05/22/2011   SHOULDER JOINT INSTABILITY 11/15/2010   SHOULDER PAIN, LEFT 11/15/2010   Personal history presenting hazards to health 11/15/2010    Past Surgical History:  Procedure Laterality Date   FRACTURE SURGERY         Home Medications    Prior to Admission medications   Medication Sig Start Date End Date Taking? Authorizing Provider  fluticasone (FLONASE) 50 MCG/ACT nasal spray Place 1 spray into both nostrils daily. 06/17/23  Yes Maren Wiesen K, PA-C  promethazine-dextromethorphan (PROMETHAZINE-DM) 6.25-15 MG/5ML syrup Take 5 mLs by mouth 4 (four) times daily as needed for cough. 06/17/23  Yes Lidiya Reise, Noberto Retort, PA-C    Family History Family History  Problem Relation Age of Onset   Breast cancer Other        1st degree relative   Hypertension Other        fam hx   Lung cancer  Other        fam hx    Social History Social History   Tobacco Use   Smoking status: Former    Types: Cigarettes    Start date: 10/05/2015   Smokeless tobacco: Current   Tobacco comments:    quit 2-3 years ago  Substance Use Topics   Alcohol use: Yes    Alcohol/week: 0.0 standard drinks of alcohol    Comment: over the weekend   Drug use: No     Allergies   Patient has no known allergies.   Review of Systems Review of Systems  Constitutional:  Positive for activity change and fever. Negative for appetite change and fatigue.  HENT:  Positive for congestion and sore throat. Negative for sinus pressure and sneezing.   Respiratory:  Positive for cough. Negative for shortness of breath.   Cardiovascular:  Negative for chest pain.  Gastrointestinal:  Positive for diarrhea and nausea. Negative for abdominal pain and vomiting.  Musculoskeletal:  Positive for arthralgias and myalgias.  Neurological:  Positive for headaches. Negative for dizziness and light-headedness.     Physical Exam Triage Vital Signs ED Triage Vitals  Encounter Vitals Group     BP 06/17/23 0923 116/81     Systolic BP Percentile --      Diastolic BP Percentile --      Pulse  Rate 06/17/23 0923 92     Resp 06/17/23 0923 18     Temp 06/17/23 0923 98.7 F (37.1 C)     Temp src --      SpO2 06/17/23 0923 97 %     Weight --      Height --      Head Circumference --      Peak Flow --      Pain Score 06/17/23 0938 3     Pain Loc --      Pain Education --      Exclude from Growth Chart --    No data found.  Updated Vital Signs BP 116/81   Pulse 92   Temp 98.7 F (37.1 C)   Resp 18   SpO2 97%   Visual Acuity Right Eye Distance:   Left Eye Distance:   Bilateral Distance:    Right Eye Near:   Left Eye Near:    Bilateral Near:     Physical Exam Vitals reviewed.  Constitutional:      General: He is awake.     Appearance: Normal appearance. He is well-developed. He is not ill-appearing.      Comments: Very pleasant male appears stated age in no acute distress sitting comfortably in exam room  HENT:     Head: Normocephalic and atraumatic.     Right Ear: Ear canal and external ear normal. A middle ear effusion is present. Tympanic membrane is not erythematous or bulging.     Left Ear: Ear canal and external ear normal. Tympanic membrane is not erythematous or bulging.     Nose: Nose normal.     Mouth/Throat:     Pharynx: Uvula midline. Posterior oropharyngeal erythema present. No oropharyngeal exudate or uvula swelling.  Cardiovascular:     Rate and Rhythm: Normal rate and regular rhythm.     Heart sounds: Normal heart sounds, S1 normal and S2 normal. No murmur heard. Pulmonary:     Effort: Pulmonary effort is normal. No accessory muscle usage or respiratory distress.     Breath sounds: Normal breath sounds. No stridor. No wheezing, rhonchi or rales.     Comments: Clear to auscultation bilaterally Abdominal:     General: Bowel sounds are normal.     Palpations: Abdomen is soft.     Tenderness: There is no abdominal tenderness.  Neurological:     Mental Status: He is alert.  Psychiatric:        Behavior: Behavior is cooperative.      UC Treatments / Results  Labs (all labs ordered are listed, but only abnormal results are displayed) Labs Reviewed - No data to display  EKG   Radiology No results found.  Procedures Procedures (including critical care time)  Medications Ordered in UC Medications - No data to display  Initial Impression / Assessment and Plan / UC Course  I have reviewed the triage vital signs and the nursing notes.  Pertinent labs & imaging results that were available during my care of the patient were reviewed by me and considered in my medical decision making (see chart for details).     Patient is well-appearing, afebrile, nontoxic, nontachycardic.  No evidence of acute infection on physical exam that warrant initiation of antibiotics.  We  discussed that symptoms are likely viral in nature.  He is outside the window of effectiveness for Tamiflu so flu testing was deferred.  Will test for COVID and contact him if this is positive.  Discussed  that given he is young and otherwise healthy not a candidate for antiviral therapy.  Will treat symptomatically with Promethazine DM.  We discussed that this can be sedating and he is not to drive or drink alcohol while taking it.  He was given fluticasone nasal spray to help with congestion.  Recommended he rest and drink plenty of fluid.  He can use over-the-counter medication including Mucinex, Tylenol, ibuprofen, nasal saline/sinus rinses.  Discussed that if his symptoms are not improving within a week he is to return for reevaluation.  If he has any worsening symptoms including worsening cough, shortness of breath, chest pain, nausea/vomiting interfere with oral intake, weakness.  Strict return precautions given.  Work excuse note provided.  Final Clinical Impressions(s) / UC Diagnoses   Final diagnoses:  Upper respiratory tract infection, unspecified type  Acute cough  Nasal congestion     Discharge Instructions      My Chart user name: [email or web address]    We are testing you for COVID.  We will contact you if you are positive.  Please monitor your MyChart for these results or call us if you have any concerns.  Use Promethazine DM for cough.  This will make you sleepy so do not drive drink alcohol while taking it.  Use fluticasone nasal spray for congestion.  I also recommend over-the-counter medications including Mucinex, Tylenol, ibuprofen as well as nasal saline/sinus rinses for additional symptom relief.  If your symptoms are not improving within a week please return for reevaluation.  If anything worsens you need to be seen immediately.     ED Prescriptions     Medication Sig Dispense Auth. Provider   fluticasone (FLONASE) 50 MCG/ACT nasal spray Place 1 spray into both nostrils daily.  16 g Breonia Kirstein K, PA-C   promethazine-dextromethorphan (PROMETHAZINE-DM) 6.25-15 MG/5ML syrup Take 5 mLs by mouth 4 (four) times daily as needed for cough. 118 mL Ermalinda Joubert K, PA-C      PDMP not reviewed this encounter.   Jeani Hawking, PA-C 06/17/23 1001

## 2023-06-18 LAB — SARS CORONAVIRUS 2 (TAT 6-24 HRS): SARS Coronavirus 2: POSITIVE — AB

## 2024-03-10 DIAGNOSIS — R102 Pelvic and perineal pain: Secondary | ICD-10-CM | POA: Diagnosis not present

## 2024-03-10 DIAGNOSIS — M47814 Spondylosis without myelopathy or radiculopathy, thoracic region: Secondary | ICD-10-CM | POA: Diagnosis not present

## 2024-03-10 DIAGNOSIS — M47812 Spondylosis without myelopathy or radiculopathy, cervical region: Secondary | ICD-10-CM | POA: Diagnosis not present

## 2024-03-10 DIAGNOSIS — M545 Low back pain, unspecified: Secondary | ICD-10-CM | POA: Diagnosis not present
